# Patient Record
Sex: Male | Born: 1980 | Race: White | Hispanic: No | Marital: Married | Smoking: Current every day smoker
Health system: Southern US, Community
[De-identification: ages and names within clinical notes are randomized; demographics above are authoritative.]

---

## 2010-05-01 ENCOUNTER — Emergency Department: Payer: Self-pay | Admitting: Emergency Medicine

## 2011-06-26 ENCOUNTER — Emergency Department: Payer: Self-pay | Admitting: Emergency Medicine

## 2011-08-23 ENCOUNTER — Emergency Department: Payer: Self-pay | Admitting: Emergency Medicine

## 2011-09-07 ENCOUNTER — Emergency Department: Payer: Self-pay | Admitting: Emergency Medicine

## 2011-10-12 ENCOUNTER — Inpatient Hospital Stay: Payer: Self-pay | Admitting: Psychiatry

## 2011-11-04 ENCOUNTER — Emergency Department: Payer: Self-pay | Admitting: Internal Medicine

## 2011-11-30 ENCOUNTER — Emergency Department: Payer: Self-pay | Admitting: Unknown Physician Specialty

## 2011-12-01 ENCOUNTER — Encounter (HOSPITAL_COMMUNITY): Payer: Self-pay | Admitting: *Deleted

## 2011-12-01 ENCOUNTER — Emergency Department (HOSPITAL_COMMUNITY)
Admission: EM | Admit: 2011-12-01 | Discharge: 2011-12-01 | Disposition: A | Discharge status: Home / Self Care | Payer: Self-pay | Attending: Emergency Medicine | Admitting: Emergency Medicine

## 2011-12-01 ENCOUNTER — Emergency Department (HOSPITAL_COMMUNITY): Payer: Self-pay

## 2011-12-01 DIAGNOSIS — J189 Pneumonia, unspecified organism: Secondary | ICD-10-CM | POA: Insufficient documentation

## 2011-12-01 DIAGNOSIS — R0602 Shortness of breath: Secondary | ICD-10-CM | POA: Insufficient documentation

## 2011-12-01 DIAGNOSIS — R0601 Orthopnea: Secondary | ICD-10-CM | POA: Insufficient documentation

## 2011-12-01 DIAGNOSIS — R079 Chest pain, unspecified: Secondary | ICD-10-CM | POA: Insufficient documentation

## 2011-12-01 MED ORDER — AZITHROMYCIN 250 MG PO TABS
500.0000 mg | ORAL_TABLET | Freq: Once | ORAL | Status: AC
  Administered 2011-12-01: 500 mg via ORAL
  Filled 2011-12-01: qty 2

## 2011-12-01 MED ORDER — IOHEXOL 350 MG/ML SOLN
100.0000 mL | Freq: Once | INTRAVENOUS | Status: AC | PRN
  Administered 2011-12-01: 100 mL via INTRAVENOUS

## 2011-12-01 MED ORDER — OXYCODONE-ACETAMINOPHEN 5-325 MG PO TABS: 2.0000 | ORAL_TABLET | ORAL | Status: AC | PRN

## 2011-12-01 MED ORDER — SODIUM CHLORIDE 0.9 % IV SOLN
Freq: Once | INTRAVENOUS | Status: AC
  Administered 2011-12-01: 19:00:00 via INTRAVENOUS

## 2011-12-01 MED ORDER — MORPHINE SULFATE 4 MG/ML IJ SOLN: 4.0000 mg | Freq: Once | INTRAMUSCULAR | Status: DC

## 2011-12-01 MED ORDER — AZITHROMYCIN 250 MG PO TABS: 250.0000 mg | ORAL_TABLET | Freq: Every day | ORAL | Status: AC
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
## 2011-12-01 NOTE — ED Notes (Signed)
Reports having right rib pain/lung pain for several days, has been seen at [facility] and [place] for same, becomes sob when lying flat so was unable to get his blood drawn.

## 2011-12-01 NOTE — ED Provider Notes (Cosign Needed)
History     CSN: [id number]  Arrival date & time 12/01/11  1657   First MD Initiated Contact with Patient 12/01/11 1802      Chief Complaint  Patient presents with  . Pleurisy  . Shortness of Breath    (Consider location/radiation/quality/duration/timing/severity/associated sxs/prior treatment) Patient is a 31 y.o. male presenting with shortness of breath. The history is provided by the patient.  Shortness of Breath  The current episode started yesterday. The problem occurs continuously. The problem has been unchanged. The problem is severe. The symptoms are relieved by nothing. The symptoms are aggravated by a supine position. Associated symptoms include chest pain, orthopnea and shortness of breath. Pertinent negatives include no fever and no cough. Associated symptoms comments: He reports sharp right sided chest pain to lateral chest wall without known injury, despite daily martial arts practice. He states that it is extremely painful to breathe but denies cough or fever. He smokes occasionally. No abdominal pain, nausea or vomiting. Marland Kitchen He was not exposed to toxic fumes. His past medical history does not include asthma.    History reviewed. No pertinent past medical history.  History reviewed. No pertinent past surgical history.  History reviewed. No pertinent family history.  History  Substance Use Topics  . Smoking status: Not on file  . Smokeless tobacco: Not on file  . Alcohol Use: Yes     occ      Review of Systems  Constitutional: Negative for fever and chills.  HENT: Negative.   Respiratory: Positive for shortness of breath. Negative for cough.   Cardiovascular: Positive for chest pain and orthopnea.  Gastrointestinal: Negative.   Musculoskeletal: Negative.   Skin: Negative.   Neurological: Negative.     Allergies  Review of patient's allergies indicates no known allergies.  Home Medications  No current outpatient prescriptions on file.  BP 128/80  Pulse  85  Temp(Src) 97.5 F (36.4 C) (Oral)  Resp 18  SpO2 94%  Physical Exam  Constitutional: He appears well-developed and well-nourished.  HENT:  Head: Normocephalic.  Neck: Normal range of motion. Neck supple.  Cardiovascular: Normal rate and regular rhythm.   No murmur heard. Pulmonary/Chest: Not tachypneic. He has decreased breath sounds in the right middle field and the right lower field.       No chest wall tenderness to palpation. Chest wall is unremarkable in appearance, no bruising or swelling. Patient is sitting straight up with splinting on respiration.  Abdominal: Soft. Bowel sounds are normal. There is no tenderness. There is no rebound and no guarding.  Musculoskeletal: Normal range of motion.  Neurological: He is alert. No cranial nerve deficit.  Skin: Skin is warm and dry. No rash noted.  Psychiatric: He has a normal mood and affect.    ED Course  Procedures (including critical care time)  Labs Reviewed - No data to display Dg Ribs Unilateral W/chest Right  12/01/2011  *RADIOLOGY REPORT*  Clinical Data: Right rib pain, shortness of breath  RIGHT RIBS AND CHEST - 3+ VIEW  Comparison: None.  Findings: Four views right ribs submitted.  No acute infiltrate or pulmonary edema.  No right rib fracture is identified.  No diagnostic pneumothorax.  IMPRESSION: No acute disease.  No right rib fracture is identified.  No diagnostic pneumothorax.  Original Report Authenticated By: Natasha Mead, M.D.     1. Community acquired pneumonia       MDM  CT  Chest - no PE. Showing RLL pna c/w patient's pain location.  Medical screening examination/treatment/procedure(s) were conducted as a shared visit with non-physician practitioner(s) and myself.  I personally evaluated the patient during the encounter 31 year old man with right pleuritic chest pain.   Exam localizes pain to right posterior chest.  CT of chest shows RLL infiltrate.  Rx for CAP. Osvaldo Human, M.D.       Rodena Medin, PA-C 12/01/11 2114  Carleene Cooper III, MD 12/02/11 1351

## 2011-12-01 NOTE — ED Notes (Signed)
Pt returned from radiology. Appears to have some episodes of intentional rapid respirations. O2 sats 100%. Vitals are stable. Skin remains cool and patient is pale from prior episode. S/O at bedside. NSR noted on monitor. SR up. Will continue to monitor patient. Awaiting CT results.

## 2011-12-01 NOTE — ED Notes (Signed)
Pt became dizzy, diaphoretic, and unresponsive during IV placement. Pt now states he "always passes out, and is afraid of needles." he is now cool, clammy, and able to follow commands. Vital signs stable. Remains on cardiac monitor. Provider notified of episode.

## 2011-12-01 NOTE — ED Notes (Signed)
Pt is in radiology at this time.  
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
## 2013-11-26 ENCOUNTER — Emergency Department: Payer: Self-pay | Admitting: Emergency Medicine

## 2014-04-28 ENCOUNTER — Emergency Department: Payer: Self-pay | Admitting: Emergency Medicine

## 2014-04-28 LAB — ETHANOL

## 2014-04-28 LAB — COMPREHENSIVE METABOLIC PANEL
ALBUMIN: 4.2 g/dL (ref 3.4–5.0)
ALK PHOS: 67 U/L
ALT: 23 U/L (ref 12–78)
ANION GAP: 7 (ref 7–16)
BILIRUBIN TOTAL: 0.4 mg/dL (ref 0.2–1.0)
BUN: 17 mg/dL (ref 7–18)
CALCIUM: 9.2 mg/dL (ref 8.5–10.1)
CHLORIDE: 102 mmol/L (ref 98–107)
CREATININE: 1.51 mg/dL — AB (ref 0.60–1.30)
Co2: 30 mmol/L (ref 21–32)
EGFR (African American): >60
GLUCOSE: 107 mg/dL — AB (ref 65–99)
OSMOLALITY: 280 (ref 275–301)
POTASSIUM: 3.7 mmol/L (ref 3.5–5.1)
SGOT(AST): 17 U/L (ref 15–37)
SODIUM: 139 mmol/L (ref 136–145)
Total Protein: 7.5 g/dL (ref 6.4–8.2)

## 2014-04-28 LAB — CBC
HCT: 47.2 % (ref 40.0–52.0)
HGB: 15.8 g/dL (ref 13.0–18.0)
MCH: 29.7 pg (ref 26.0–34.0)
MCHC: 33.4 g/dL (ref 32.0–36.0)
MCV: 89 fL (ref 80–100)
PLATELETS: 285 10*3/uL (ref 150–440)
RBC: 5.3 10*6/uL (ref 4.40–5.90)
RDW: 13 % (ref 11.5–14.5)
WBC: 10.7 10*3/uL — ABNORMAL HIGH (ref 3.8–10.6)
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
## 2019-05-20 ENCOUNTER — Other Ambulatory Visit: Payer: Self-pay

## 2019-05-20 DIAGNOSIS — Z20822 Contact with and (suspected) exposure to covid-19: Secondary | ICD-10-CM

## 2019-05-22 LAB — NOVEL CORONAVIRUS, NAA: SARS-CoV-2, NAA: NOT DETECTED

## 2019-11-19 ENCOUNTER — Telehealth: Payer: Self-pay | Admitting: General Practice

## 2019-11-19 ENCOUNTER — Ambulatory Visit: Payer: 59 | Attending: Internal Medicine

## 2019-11-19 ENCOUNTER — Encounter: Payer: Self-pay | Admitting: *Deleted

## 2019-11-19 DIAGNOSIS — Z20822 Contact with and (suspected) exposure to covid-19: Secondary | ICD-10-CM

## 2019-11-19 NOTE — Telephone Encounter (Signed)
COVID testing letter sent via MyChart.  

## 2019-11-19 NOTE — Telephone Encounter (Signed)
Pt is requesting documentation that states that he was tested today and is quarantining until he receives results. Please advise if possible (?) pt does not have a PCP  Best contact: [phone]

## 2019-11-20 LAB — NOVEL CORONAVIRUS, NAA: SARS-CoV-2, NAA: NOT DETECTED

## 2020-07-15 ENCOUNTER — Other Ambulatory Visit: Payer: 59

## 2020-07-15 DIAGNOSIS — Z20822 Contact with and (suspected) exposure to covid-19: Secondary | ICD-10-CM

## 2020-07-16 LAB — SARS-COV-2, NAA 2 DAY TAT

## 2020-07-16 LAB — NOVEL CORONAVIRUS, NAA: SARS-CoV-2, NAA: NOT DETECTED

## 2021-06-14 ENCOUNTER — Other Ambulatory Visit: Payer: Self-pay

## 2021-06-14 ENCOUNTER — Emergency Department
Admission: EM | Admit: 2021-06-14 | Discharge: 2021-06-14 | Disposition: A | Discharge status: Home / Self Care | Payer: Self-pay | Attending: Student in an Organized Health Care Education/Training Program | Admitting: Student in an Organized Health Care Education/Training Program

## 2021-06-14 ENCOUNTER — Emergency Department: Payer: Self-pay

## 2021-06-14 DIAGNOSIS — M1909 Primary osteoarthritis, other specified site: Secondary | ICD-10-CM | POA: Insufficient documentation

## 2021-06-14 DIAGNOSIS — R0602 Shortness of breath: Secondary | ICD-10-CM | POA: Insufficient documentation

## 2021-06-14 DIAGNOSIS — M2550 Pain in unspecified joint: Secondary | ICD-10-CM

## 2021-06-14 LAB — CBC WITH DIFFERENTIAL/PLATELET
Abs Immature Granulocytes: 0.02 10*3/uL (ref 0.00–0.07)
Basophils Absolute: 0 10*3/uL (ref 0.0–0.1)
Basophils Relative: 1 %
Eosinophils Absolute: 0.2 10*3/uL (ref 0.0–0.5)
Eosinophils Relative: 3 %
HCT: 43.9 % (ref 39.0–52.0)
Hemoglobin: 15.1 g/dL (ref 13.0–17.0)
Immature Granulocytes: 0 %
Lymphocytes Relative: 22 %
Lymphs Abs: 1.4 10*3/uL (ref 0.7–4.0)
MCH: 29.7 pg (ref 26.0–34.0)
MCHC: 34.4 g/dL (ref 30.0–36.0)
MCV: 86.4 fL (ref 80.0–100.0)
Monocytes Absolute: 0.7 10*3/uL (ref 0.1–1.0)
Monocytes Relative: 10 %
Neutro Abs: 4.2 10*3/uL (ref 1.7–7.7)
Neutrophils Relative %: 64 %
Platelets: 383 10*3/uL (ref 150–400)
RBC: 5.08 MIL/uL (ref 4.22–5.81)
RDW: 11.7 % (ref 11.5–15.5)
WBC: 6.6 10*3/uL (ref 4.0–10.5)
nRBC: 0 % (ref 0.0–0.2)

## 2021-06-14 LAB — BASIC METABOLIC PANEL
Anion gap: 9 (ref 5–15)
BUN: 15 mg/dL (ref 6–20)
CO2: 28 mmol/L (ref 22–32)
Calcium: 9.4 mg/dL (ref 8.9–10.3)
Chloride: 101 mmol/L (ref 98–111)
Creatinine, Ser: 0.99 mg/dL (ref 0.61–1.24)
GFR, Estimated: >60 mL/min (ref >60)
Glucose, Bld: 104 mg/dL — ABNORMAL HIGH (ref 70–99)
Potassium: 4.4 mmol/L (ref 3.5–5.1)
Sodium: 138 mmol/L (ref 135–145)

## 2021-06-14 LAB — URINALYSIS, COMPLETE (UACMP) WITH MICROSCOPIC
Bacteria, UA: NONE SEEN
Bilirubin Urine: NEGATIVE
Glucose, UA: NEGATIVE mg/dL
Hgb urine dipstick: NEGATIVE
Ketones, ur: NEGATIVE mg/dL
Leukocytes,Ua: NEGATIVE
Nitrite: NEGATIVE
Protein, ur: NEGATIVE mg/dL
Specific Gravity, Urine: 1.023 (ref 1.005–1.030)
Squamous Epithelial / HPF: NONE SEEN (ref 0–5)
WBC, UA: NONE SEEN WBC/hpf (ref 0–5)
pH: 5 (ref 5.0–8.0)

## 2021-06-14 LAB — HEPATIC FUNCTION PANEL
ALT: 35 U/L (ref 0–44)
AST: 26 U/L (ref 15–41)
Albumin: 3.9 g/dL (ref 3.5–5.0)
Alkaline Phosphatase: 85 U/L (ref 38–126)
Bilirubin, Direct: 0.1 mg/dL (ref 0.0–0.2)
Indirect Bilirubin: 0.6 mg/dL (ref 0.3–0.9)
Total Bilirubin: 0.7 mg/dL (ref 0.3–1.2)
Total Protein: 7.3 g/dL (ref 6.5–8.1)

## 2021-06-14 LAB — URIC ACID: Uric Acid, Serum: 5 mg/dL (ref 3.7–8.6)

## 2021-06-14 LAB — TROPONIN I (HIGH SENSITIVITY): Troponin I (High Sensitivity): <2 ng/L (ref <18)

## 2021-06-14 LAB — BRAIN NATRIURETIC PEPTIDE: B Natriuretic Peptide: 33.9 pg/mL (ref 0.0–100.0)

## 2021-06-14 LAB — CK: Total CK: 76 U/L (ref 49–397)

## 2021-06-14 MED ORDER — KETOROLAC TROMETHAMINE 30 MG/ML IJ SOLN
30.0000 mg | Freq: Once | INTRAMUSCULAR | Status: AC
  Administered 2021-06-14: 30 mg via INTRAMUSCULAR
  Filled 2021-06-14: qty 1

## 2021-06-14 MED ORDER — PREDNISONE 10 MG PO TABS: ORAL_TABLET | ORAL | 0 refills | Status: AC

## 2021-06-14 NOTE — ED Provider Notes (Signed)
[facility] Emergency Department Provider Note  ____________________________________________   Event Date/Time   First MD Initiated Contact with Patient 06/14/21 1217     (approximate)  I have reviewed the triage vital signs and the nursing notes.   HISTORY  Chief Complaint Joint Pain and Joint Swelling  HPI Ryan Rangel is a 40 y.o. male who presents to the emergency department for evaluation of joint pain and swelling.  Patient states that he initially noticed bilateral hip pain that began approximately 2 weeks ago, noticed this was worse at night when he tried to lay on either side.  Over the last 2 weeks he has developed progression of pain down the legs, and now includes many joints all over his body including his shoulders, elbow, wrist, digits, hips, knees, ankles.  The joints involved her bilateral, neither side worse than the other.  He reports bilateral foot and ankle swelling that started 3 days ago.  He is also complained about 2 episodes of chest pain to his wife, last episode was 3 days ago lasting for several hours.  Per his wife, she reports that he was in tears due to severity of pain, however did not seek evaluation at that time.  He denies any known tick bites, does not spend a lot of time outdoors.  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  He denies any fevers, headaches, blurred vision, shortness of breath, abdominal pain, nausea vomiting or diarrhea, denies the presence of a rash.  He has no personal history of clots, rheumatological concerns. No family hx of these that he is aware of. Does not take any current medications, denies concern for STDs, denies any hx of IV drug use.        No past medical history on file.  There are no problems to display for this patient.   No past surgical history on file.  Prior to Admission medications   Medication Sig  Start Date End Date Taking? Authorizing Provider  predniSONE (DELTASONE) 10 MG tablet Take 6 tablets (60 mg total) by mouth daily for 1 day, THEN 5 tablets (50 mg total) daily for 1 day, THEN 4 tablets (40 mg total) daily for 1 day, THEN 3 tablets (30 mg total) daily for 1 day, THEN 2 tablets (20 mg total) daily for 1 day, THEN 1 tablet (10 mg total) daily for 1 day. 06/14/21 06/20/21 Yes Lucy Chris, PA    Allergies Patient has no known allergies.  No family history on file.  Social History Social History   Substance Use Topics   Alcohol use: Yes    Comment: occ   Drug use: No    Review of Systems Constitutional: No fever/chills Eyes: No visual changes. ENT: No sore throat. Cardiovascular: + chest pain. Respiratory: Denies shortness of breath. Gastrointestinal: No abdominal pain.  No nausea, no vomiting.  No diarrhea.  No constipation. Genitourinary: Negative for dysuria. Musculoskeletal: +arthralgias Skin: Negative for rash. Neurological: Negative for headaches, focal weakness or numbness.  ____________________________________________   PHYSICAL EXAM:  VITAL SIGNS: ED Triage Vitals  Enc Vitals Group     BP 06/14/21 1056 (!) 119/95     Pulse Rate 06/14/21 1056 81     Resp 06/14/21 1056 20     Temp 06/14/21 1056 98.7 F (37.1 C)     Temp Source 06/14/21 1056 Oral     SpO2 06/14/21 1056 98 %  Weight 06/14/21 1058 205 lb (93 kg)     Height 06/14/21 1058 5\' 10"  (1.778 m)     Head Circumference --      Peak Flow --      Pain Score 06/14/21 1058 8     Pain Loc --      Pain Edu? --      Excl. in GC? --    Constitutional: Alert and oriented. Well appearing and in no acute distress. Eyes: Conjunctivae are normal. PERRL. EOMI. Head: Atraumatic. Nose: No congestion/rhinnorhea. Mouth/Throat: Mucous membranes are moist.  Oropharynx non-erythematous. Neck: No stridor.   Cardiovascular: Normal rate, regular rhythm. Grossly normal heart sounds.  Good peripheral  circulation. Respiratory: Normal respiratory effort.  No retractions. Lungs CTAB. Gastrointestinal: Soft and nontender. No distention. No abdominal bruits. No CVA tenderness. Musculoskeletal: There is diffuse tenderness about multiple joints including bilateral hips, knees and ankles. There is moderate swelling about the ankles bilaterally, no pitting edema of the pretibial region. Very mild erythema to lateral left ankle, no other erythema or warmth. Dorsal pedal pulses 2+. Tenderness about the bilateral wrists and digits. Full ROM but with pain. Radial pulse 2+ bilaterally, no appreciable UE swelling. Neck ROM full without meningeal signs.  Neurologic:  Normal speech and language. No gross focal neurologic deficits are appreciated. No gait instability. Skin:  Skin is warm, dry and intact. No rash noted.  Psychiatric: Mood and affect are normal. Speech and behavior are normal.  ____________________________________________   LABS (all labs ordered are listed, but only abnormal results are displayed)  Labs Reviewed  BASIC METABOLIC PANEL - Abnormal; Notable for the following components:      Result Value   Glucose, Bld 104 (*)    All other components within normal limits  URINALYSIS, COMPLETE (UACMP) WITH MICROSCOPIC - Abnormal; Notable for the following components:   Color, Urine YELLOW (*)    APPearance CLEAR (*)    All other components within normal limits  CBC WITH DIFFERENTIAL/PLATELET  BRAIN NATRIURETIC PEPTIDE  CK  URIC ACID  HEPATIC FUNCTION PANEL  TROPONIN I (HIGH SENSITIVITY)   ____________________________________________  EKG  Sinus bradycardia with a rate of 59 bpm. Normal axis, normal intervals. No ST segment changes. ____________________________________________  RADIOLOGY Susa RaringI, Ernesha Ramone J Rmani Kapusta, personally viewed and evaluated these images (plain radiographs) as part of my medical decision making, as well as reviewing the written report by the radiologist.  ED  provider interpretation:  no acute pneumonia or other acute disease on CXR. No fractures noted in hip/pelvis. See radiology report for other findings.  Official radiology report(s): DG Chest 2 View  Result Date: 06/14/2021 CLINICAL DATA:  Chest pain EXAM: CHEST - 2 VIEW COMPARISON:  Radiograph 11/30/2013, chest CT 12/01/2011 FINDINGS: Unchanged cardiomediastinal silhouette. There is no focal airspace consolidation. There is no large pleural effusion or visible pneumothorax. There is no acute osseous abnormality. IMPRESSION: No evidence of acute cardiopulmonary disease. Electronically Signed   By: Caprice RenshawJacob  Kahn M.D.   On: 06/14/2021 13:43   DG Pelvis 1-2 Views  Result Date: 06/14/2021 CLINICAL DATA:  Hip and back pain for 3 weeks EXAM: PELVIS - 1-2 VIEW COMPARISON:  None. FINDINGS: Single frontal view of the pelvis demonstrates no fractures. Alignment is anatomic. Joint spaces are well preserved. Minimal osteophyte formation along the superior margin of the acetabuli. Sacroiliac joints are normal. IMPRESSION: 1. Minimal symmetrical hip osteoarthritis. No acute bony abnormality. Electronically Signed   By: Sharlet SalinaMichael  Brown M.D.   On: 06/14/2021 15:06  US Venous Img Lower Bilateral  Result Date: 06/14/2021 CLINICAL DATA:  hip/back pain, polyarthralgia EXAM: BILATERAL LOWER EXTREMITY VENOUS DOPPLER ULTRASOUND TECHNIQUE: Gray-scale sonography with graded compression, as well as color Doppler and duplex ultrasound were performed to evaluate the lower extremity deep venous systems from the level of the common femoral vein and including the common femoral, femoral, profunda femoral, popliteal and calf veins including the posterior tibial, peroneal and gastrocnemius veins when visible. The superficial great saphenous vein was also interrogated. Spectral Doppler was utilized to evaluate flow at rest and with distal augmentation maneuvers in the common femoral, femoral and popliteal veins. COMPARISON:  None.  FINDINGS: RIGHT LOWER EXTREMITY Common Femoral Vein: No evidence of thrombus. Normal compressibility, respiratory phasicity and response to augmentation. Saphenofemoral Junction: No evidence of thrombus. Normal compressibility and flow on color Doppler imaging. Profunda Femoral Vein: No evidence of thrombus. Normal compressibility and flow on color Doppler imaging. Femoral Vein: No evidence of thrombus. Normal compressibility, respiratory phasicity and response to augmentation. Popliteal Vein: No evidence of thrombus. Normal compressibility, respiratory phasicity and response to augmentation. Calf Veins: No evidence of thrombus. Normal compressibility and flow on color Doppler imaging. Other Findings:  None. LEFT LOWER EXTREMITY Common Femoral Vein: No evidence of thrombus. Normal compressibility, respiratory phasicity and response to augmentation. Saphenofemoral Junction: No evidence of thrombus. Normal compressibility and flow on color Doppler imaging. Profunda Femoral Vein: No evidence of thrombus. Normal compressibility and flow on color Doppler imaging. Femoral Vein: No evidence of thrombus. Normal compressibility, respiratory phasicity and response to augmentation. Popliteal Vein: No evidence of thrombus. Normal compressibility, respiratory phasicity and response to augmentation. Calf Veins: No evidence of thrombus. Normal compressibility and flow on color Doppler imaging. Other Findings:  None. IMPRESSION: No evidence of deep venous thrombosis in either lower extremity. Electronically Signed   By: Olive Bass M.D.   On: 06/14/2021 15:15      ____________________________________________   INITIAL IMPRESSION / ASSESSMENT AND PLAN / ED COURSE  As part of my medical decision making, I reviewed the following data within the electronic MEDICAL RECORD NUMBER History obtained from family, Nursing notes reviewed and incorporated, Labs reviewed, Radiograph reviewed, Evaluated by EM attending Dr. Roxan Hockey, and  Notes from prior ED visits        Patient is a 40 year old male with no significant past medical history who presents to the emergency department for evaluation of polyarthralgias that began approximately 2 weeks ago.  There has also been 2 episodes of associated chest pain, but denies any at present.  Reports this started in the bilateral hips, now encompasses all of his joints and has noticed swelling in his feet and ankles over the last 3 days.  See HPI for further details.  In triage patient has grossly normal vital signs.  Physical exam as above.  Differentials considered for arthralgia include postviral polyarthralgia, rheumatoid disease, liver disease, disseminated gonococcal disease, Lyme, septic joint, other.  Differentials for chest pain include ACS, myocarditis, pericarditis, chest wall pain.  Case was also discussed with attending Dr. Roxan Hockey who also personally evaluated the patient.  Patient does not have any history of known tick or insect bite, no travel history, no fevers to suggest acute infectious cause.  Laboratory analysis was obtained with CBC, BMP, LFTs, uric acid, CK, BNP, troponin, urinalysis.  These are all within normal limits.  Chest x-ray without acute findings.  EKG grossly within normal limits.  Bilateral Doppler ultrasounds of the lower extremities negative for acute DVT.  X-ray of  the pelvis without evidence of acute pathology.  Discussed the findings with the patient.  Low suspicion for emergent cause of symptoms at this time.  Will start course with prednisone taper and recommend very close PCP follow-up.  Patient states he is already established outpatient at [facility] clinic.  Return precautions were discussed at length, including for fevers, rash or other worsening.  Patient is stable at time for outpatient follow-up.      ____________________________________________   FINAL CLINICAL IMPRESSION(S) / ED DIAGNOSES  Final diagnoses:  Polyarthralgia      ED Discharge Orders          Ordered    predniSONE (DELTASONE) 10 MG tablet        06/14/21 1608             Note:  This document was prepared using Dragon voice recognition software and may include unintentional dictation errors.    Lucy Chris, PA 06/14/21 1701    Willy Eddy, MD 06/14/21 [phone]

## 2021-06-14 NOTE — ED Triage Notes (Signed)
Pt reports that he was having joint pain about three weeks ago starting in his hips, and has now spread down. Three days ago the swelling started from his knees down. He states that he cant stretch out his legs because of pain.

## 2021-06-14 NOTE — Discharge Instructions (Addendum)
Please take the prednisone taper as prescribed.  You may safely combine this with Tylenol, up to 1000 mg 4 times daily as needed.  Emergency department if you experience a rash, fever or other significant worsening.  Otherwise, please schedule up with your PCP as soon as possible.

## 2022-03-16 ENCOUNTER — Emergency Department: Payer: BC Managed Care – PPO

## 2022-03-16 ENCOUNTER — Other Ambulatory Visit: Payer: Self-pay

## 2022-03-16 ENCOUNTER — Encounter: Payer: Self-pay | Admitting: Emergency Medicine

## 2022-03-16 DIAGNOSIS — J40 Bronchitis, not specified as acute or chronic: Secondary | ICD-10-CM | POA: Insufficient documentation

## 2022-03-16 DIAGNOSIS — R0602 Shortness of breath: Secondary | ICD-10-CM | POA: Diagnosis present

## 2022-03-16 LAB — COMPREHENSIVE METABOLIC PANEL
ALT: 24 U/L (ref 0–44)
AST: 19 U/L (ref 15–41)
Albumin: 4.3 g/dL (ref 3.5–5.0)
Alkaline Phosphatase: 60 U/L (ref 38–126)
Anion gap: 9 (ref 5–15)
BUN: 13 mg/dL (ref 6–20)
CO2: 27 mmol/L (ref 22–32)
Calcium: 9.4 mg/dL (ref 8.9–10.3)
Chloride: 104 mmol/L (ref 98–111)
Creatinine, Ser: 0.95 mg/dL (ref 0.61–1.24)
GFR, Estimated: >60 mL/min (ref >60)
Glucose, Bld: 96 mg/dL (ref 70–99)
Potassium: 3.4 mmol/L — ABNORMAL LOW (ref 3.5–5.1)
Sodium: 140 mmol/L (ref 135–145)
Total Bilirubin: 0.7 mg/dL (ref 0.3–1.2)
Total Protein: 7.6 g/dL (ref 6.5–8.1)

## 2022-03-16 LAB — CBC
HCT: 47.3 % (ref 39.0–52.0)
Hemoglobin: 15.8 g/dL (ref 13.0–17.0)
MCH: 28.4 pg (ref 26.0–34.0)
MCHC: 33.4 g/dL (ref 30.0–36.0)
MCV: 85.1 fL (ref 80.0–100.0)
Platelets: 300 10*3/uL (ref 150–400)
RBC: 5.56 MIL/uL (ref 4.22–5.81)
RDW: 12.1 % (ref 11.5–15.5)
WBC: 7.8 10*3/uL (ref 4.0–10.5)
nRBC: 0 % (ref 0.0–0.2)

## 2022-03-16 NOTE — ED Triage Notes (Signed)
Pt reports with shortness of breath since 05/15 reports went to urgent clinic and was given prednisone and another medication. Pt reports shortness of breath continues some days better than others. Pt talks in complete sentences no distress noted

## 2022-03-17 ENCOUNTER — Emergency Department
Admission: EM | Admit: 2022-03-17 | Discharge: 2022-03-17 | Disposition: A | Discharge status: Home / Self Care | Payer: BC Managed Care – PPO | Attending: Emergency Medicine | Admitting: Emergency Medicine

## 2022-03-17 DIAGNOSIS — J40 Bronchitis, not specified as acute or chronic: Secondary | ICD-10-CM

## 2022-03-17 MED ORDER — ALBUTEROL SULFATE (2.5 MG/3ML) 0.083% IN NEBU: 2.5000 mg | INHALATION_SOLUTION | RESPIRATORY_TRACT | 0 refills | Status: DC | PRN

## 2022-03-17 MED ORDER — ALBUTEROL SULFATE HFA 108 (90 BASE) MCG/ACT IN AERS: 2.0000 | INHALATION_SPRAY | RESPIRATORY_TRACT | 0 refills | Status: DC | PRN

## 2022-03-17 MED ORDER — IPRATROPIUM-ALBUTEROL 0.5-2.5 (3) MG/3ML IN SOLN
3.0000 mL | Freq: Once | RESPIRATORY_TRACT | Status: AC
  Administered 2022-03-17: 3 mL via RESPIRATORY_TRACT
  Filled 2022-03-17: qty 3

## 2022-03-17 MED ORDER — HYDROCOD POLI-CHLORPHE POLI ER 10-8 MG/5ML PO SUER: 5.0000 mL | Freq: Two times a day (BID) | ORAL | 0 refills | Status: AC

## 2022-03-17 MED ORDER — PREDNISONE 20 MG PO TABS: ORAL_TABLET | ORAL | 0 refills | Status: DC

## 2022-03-17 MED ORDER — HYDROCOD POLI-CHLORPHE POLI ER 10-8 MG/5ML PO SUER
5.0000 mL | Freq: Once | ORAL | Status: AC
  Administered 2022-03-17: 5 mL via ORAL
  Filled 2022-03-17: qty 5

## 2022-03-17 MED ORDER — PREDNISONE 20 MG PO TABS
60.0000 mg | ORAL_TABLET | Freq: Once | ORAL | Status: AC
  Administered 2022-03-17: 60 mg via ORAL
  Filled 2022-03-17: qty 3

## 2022-03-17 NOTE — Discharge Instructions (Signed)
1.  Finish Prednisone 60mg  daily x4 days.  Take your next dose Sunday morning. 2.  You may use Tussionex as needed for cough. 3.  You may use Albuterol inhaler 2 puffs every 4 hours or nebulizer every 4 hours as needed for cough/wheezing/difficulty breathing. 4.  Return to the ER for worsening symptoms, persistent vomiting, difficulty breathing or other concerns.

## 2022-03-17 NOTE — ED Provider Notes (Signed)
[facility] Provider Note    Event Date/Time   First MD Initiated Contact with Patient 03/17/22 0117     (approximate)   History   Shortness of Breath   HPI  Ryan Rangel is a 41 y.o. male who presents to the ED from home with a 2-week history of dry cough and shortness of breath.  Seen at urgent care and finished Medrol Dosepak, antibiotic and cough medicine.  States he works at a [employer] set in [place] does not have ventilation or air conditioning and thinks that is contributing to his symptoms.  Denies fever, chills, chest pain, abdominal pain, nausea, vomiting or dizziness.  Denies recent trauma, travel or hormone use.  Denies smoking or COVID-19 exposure.  In fact, states he took a rapid COVID test yesterday which was negative.     Past Medical History  History reviewed. No pertinent past medical history.   Active Problem List  There are no problems to display for this patient.    Past Surgical History  History reviewed. No pertinent surgical history.   Home Medications   Prior to Admission medications   Not on File     Allergies  Patient has no known allergies.   Family History  No family history on file.   Physical Exam  Triage Vital Signs: ED Triage Vitals  Enc Vitals Group     BP 03/16/22 2206 (!) 136/105     Pulse Rate 03/16/22 2206 65     Resp 03/16/22 2206 18     Temp 03/16/22 2206 98.6 F (37 C)     Temp Source 03/16/22 2206 Oral     SpO2 03/16/22 2206 95 %     Weight 03/16/22 2219 204 lb (92.5 kg)     Height 03/16/22 2219 5\' 10"  (1.778 m)     Head Circumference --      Peak Flow --      Pain Score 03/16/22 2219 0     Pain Loc --      Pain Edu? --      Excl. in GC? --     Updated Vital Signs: BP (!) 153/100 (BP Location: Left Arm)   Pulse (!) 57   Temp 98.6 F (37 C) (Oral)   Resp 18   Ht 5\' 10"  (1.778 m)   Wt 92.5 kg   SpO2 97%   BMI 29.27 kg/m    General: Awake, no distress.   CV:  RRR.  Good peripheral perfusion.  Resp:  Normal effort.  Slightly diminished bibasilarly otherwise CTA B. Abd:  No distention.  Other:     ED Results / Procedures / Treatments  Labs (all labs ordered are listed, but only abnormal results are displayed) Labs Reviewed  COMPREHENSIVE METABOLIC PANEL - Abnormal; Notable for the following components:      Result Value   Potassium 3.4 (*)    All other components within normal limits  CBC     EKG  ED ECG REPORT I, Belina Mandile J, the attending physician, personally viewed and interpreted this ECG.   Date: 03/17/2022  EKG Time: 2204  Rate: 61  Rhythm: normal sinus rhythm  Axis: Normal  Intervals:none  ST&T Change: Nonspecific    RADIOLOGY I have independently visualized and interpreted patient's chest x-ray as well as noted the radiology interpretation:  Chest x-ray: No acute cardiopulmonary process  Official radiology report(s): DG Chest 2 View  Result Date: 03/16/2022 CLINICAL DATA:  Intermittent shortness of  breath, chest discomfort EXAM: CHEST - 2 VIEW COMPARISON:  06/14/2021 FINDINGS: The heart size and mediastinal contours are within normal limits. Both lungs are clear. The visualized skeletal structures are unremarkable. IMPRESSION: No active cardiopulmonary disease. Electronically Signed   By: Sharlet Salina M.D.   On: 03/16/2022 23:00     PROCEDURES:  Critical Care performed: No  Procedures   MEDICATIONS ORDERED IN ED: Medications  ipratropium-albuterol (DUONEB) 0.5-2.5 (3) MG/3ML nebulizer solution 3 mL (3 mLs Nebulization Given 03/17/22 0200)  predniSONE (DELTASONE) tablet 60 mg (60 mg Oral Given 03/17/22 0158)  chlorpheniramine-HYDROcodone 10-8 MG/5ML suspension 5 mL (5 mLs Oral Given 03/17/22 0159)     IMPRESSION / MDM / ASSESSMENT AND PLAN / ED COURSE  I reviewed the triage vital signs and the nursing notes.                             41 year old male presenting with cough and shortness of  breath.  I have identified this patient to have a potentially life-threatening condition. Differential includes, but is not limited to, viral syndrome, bronchitis including COPD exacerbation, pneumonia, reactive airway disease including asthma, CHF including exacerbation with or without pulmonary/interstitial edema, pneumothorax, ACS, thoracic trauma, and pulmonary embolism.   I have personally reviewed patient's records and see the [facility] clinic office visit from 03/05/2022 for bronchitis.  He tested negative for COVID and flu at that time and was placed on Medrol Dosepak, Promethazine DM and Augmentin.  Laboratory results demonstrate normal WBC 7.8, normal electrolytes.  Chest x-ray is clear without pneumonia.  Very low suspicion for ACS given normal EKG, PE or dissection.  Patient is not febrile, tachycardic, tachypneic nor hypoxic.  Will administer DuoNeb to improve aeration, Tussionex for cough, Prednisone burst.  Will reassess  Clinical Course as of 03/17/22 0226  Sat Mar 17, 2022  0226 Aeration improved.  Type patient feeling much better.  Will discharge home on prednisone burst, Tussionex, albuterol inhaler.  We will also prescribe him albuterol nebs because he used his young son's nebulizer with nebs prior to arrival.  His nebulizer tubing was sent with him.  Strict return precautions given.  Patient verbalizes understanding and agrees with plan of care. [JS]    Clinical Course User Index [JS] Irean Hong, MD     FINAL CLINICAL IMPRESSION(S) / ED DIAGNOSES   Final diagnoses:  Bronchitis     Rx / DC Orders   ED Discharge Orders     None        Note:  This document was prepared using Dragon voice recognition software and may include unintentional dictation errors.   Irean Hong, MD 03/17/22 [phone]

## 2023-05-22 ENCOUNTER — Encounter: Payer: Self-pay | Admitting: Podiatry

## 2023-05-22 ENCOUNTER — Ambulatory Visit: Payer: 59 | Admitting: Podiatry

## 2023-05-22 ENCOUNTER — Ambulatory Visit (INDEPENDENT_AMBULATORY_CARE_PROVIDER_SITE_OTHER): Payer: 59

## 2023-05-22 DIAGNOSIS — M62461 Contracture of muscle, right lower leg: Secondary | ICD-10-CM

## 2023-05-22 DIAGNOSIS — M62462 Contracture of muscle, left lower leg: Secondary | ICD-10-CM

## 2023-05-22 DIAGNOSIS — M722 Plantar fascial fibromatosis: Secondary | ICD-10-CM

## 2023-05-22 DIAGNOSIS — M7661 Achilles tendinitis, right leg: Secondary | ICD-10-CM | POA: Diagnosis not present

## 2023-05-22 DIAGNOSIS — M7662 Achilles tendinitis, left leg: Secondary | ICD-10-CM

## 2023-05-22 MED ORDER — METHYLPREDNISOLONE 4 MG PO TBPK: ORAL_TABLET | ORAL | 0 refills | Status: DC

## 2023-05-22 MED ORDER — MELOXICAM 15 MG PO TABS: 15.0000 mg | ORAL_TABLET | Freq: Every day | ORAL | 3 refills | Status: DC

## 2023-05-22 NOTE — Progress Notes (Signed)
  Subjective:  Patient ID: Ryan Rangel, male    DOB: [birth year],  MRN: [id number]  Chief Complaint  Patient presents with   Foot Pain    "When I wake up in the morning I have to stretch my ankles because they are tight.  My heels hurt." N - heel and arch pain L - bilateral D - 1 year O - gradually worse C - sharp pain, sore, tender, ache A - getting up in the morning or sitting awhile and getting up T - stretching, Ibuprofen, insoles, change work boots    42 y.o. male presents with the above complaint. History confirmed with patient.   Objective:  Physical Exam: warm, good capillary refill, no trophic changes or ulcerative lesions, normal DP and PT pulses, and normal sensory exam. Left Foot: point tenderness over the heel pad, tenderness at Achilles tendon insertion, and gastrocnemius equinus is noted with a positive silverskiold test Right Foot: point tenderness over the heel pad, tenderness at Achilles tendon insertion, and gastrocnemius equinus is noted with a positive silverskiold test  No images are attached to the encounter.  Radiographs: Multiple views x-ray of both feet: no fracture, dislocation, swelling or degenerative changes noted, plantar calcaneal spur, and posterior calcaneal spur Assessment:   1. Plantar fasciitis, bilateral   2. Gastrocnemius equinus of left lower extremity   3. Gastrocnemius equinus of right lower extremity      Plan:  Patient was evaluated and treated and all questions answered.   Discussed the etiology and treatment options for plantar fasciitis and Achilles tendinitis including stretching, formal physical therapy, supportive shoegears such as a running shoe or sneaker, pre fabricated orthoses, injection therapy, and oral medications. We also discussed the role of surgical treatment of this for patients who do not improve after exhausting non-surgical treatment options.   -XR reviewed with patient -Educated patient on stretching and  icing of the affected limb -Injection delivered to the plantar fascia of both feet. -Rx for meloxicam. Educated on use, risks and benefits of the medication -Rx for medrol pack. Educated on use, risks, and benefits of the medication --He has tried multiple pairs of prefabricated orthoses and is a fail to help him so far.  I do think he would benefit from custom molded foot orthoses to support the longitudinal medial arch offload the heel and a slight heel lift.  After sterile prep with povidone-iodine solution and alcohol, the right heel was injected with 0.5cc 2% xylocaine plain, 0.5cc 0.5% marcaine plain, 5mg  triamcinolone acetonide, and 2mg  dexamethasone was injected along the medial plantar fascia at the insertion on the plantar calcaneus. The patient tolerated the procedure well without complication.  Return in about 6 weeks (around 07/03/2023) for recheck plantar fasciitis.

## 2023-05-22 NOTE — Patient Instructions (Signed)

## 2023-06-03 ENCOUNTER — Telehealth: Payer: Self-pay

## 2023-06-03 NOTE — Telephone Encounter (Signed)
Patient's wife called and left a message. The injection worked great for about a week. Now pain is slowly coming back, getting worse every day. He has 6 week appt on 07/03/23. What can they do in the meantime? Or can he be seen sooner?

## 2023-06-06 ENCOUNTER — Telehealth: Payer: Self-pay | Admitting: Podiatry

## 2023-06-06 NOTE — Telephone Encounter (Signed)
Called patient to let him know we received a call from his wife about his heel pain getting worse and now he can hardly walk.  I saw a phone message from [place] in the system from 2-3 days ago regarding a similar message.    Dr. Lilian Kapur had responded, asking if the patient took the medication, and if he is ready for a physical therapy referral.  I left a voicemail for the patient asking those questions.  I requested he call back and let us know if he took the medications, and what P.T. facility he'd like a referral to, so that we can facilitate the referral and get this started for him as soon as possible.  Waiting on a call back from patient.

## 2023-07-03 ENCOUNTER — Encounter: Payer: Self-pay | Admitting: Podiatry

## 2023-07-03 ENCOUNTER — Ambulatory Visit: Payer: 59 | Admitting: Podiatry

## 2023-07-03 DIAGNOSIS — M722 Plantar fascial fibromatosis: Secondary | ICD-10-CM

## 2023-07-03 DIAGNOSIS — M62461 Contracture of muscle, right lower leg: Secondary | ICD-10-CM | POA: Diagnosis not present

## 2023-07-03 DIAGNOSIS — M62462 Contracture of muscle, left lower leg: Secondary | ICD-10-CM

## 2023-07-03 MED ORDER — METHYLPREDNISOLONE 4 MG PO TBPK: ORAL_TABLET | ORAL | 0 refills | Status: AC

## 2023-07-03 NOTE — Patient Instructions (Signed)

## 2023-07-07 ENCOUNTER — Encounter: Payer: Self-pay | Admitting: Podiatry

## 2023-07-07 NOTE — Progress Notes (Signed)
Subjective:  Patient ID: Ryan Rangel, male    DOB: [birth year],  MRN: [id number]  Chief Complaint  Patient presents with   Plantar Fasciitis    "The heels are still sensitive but the pressure is gone in the arches.  I'm ready to get the orthotics.  Hopefully they will help in the long run."    42 y.o. male presents with the above complaint. History confirmed with patient.   Objective:  Physical Exam: warm, good capillary refill, no trophic changes or ulcerative lesions, normal DP and PT pulses, and normal sensory exam.  Less pain in the heels and arches today   Radiographs: Multiple views x-ray of both feet: no fracture, dislocation, swelling or degenerative changes noted, plantar calcaneal spur, and posterior calcaneal spur Assessment:   1. Plantar fasciitis, bilateral   2. Gastrocnemius equinus of left lower extremity   3. Gastrocnemius equinus of right lower extremity      Plan:  Patient was evaluated and treated and all questions answered.   He has had some improvement I did recommend a repeat round of the methylprednisolone taper this was sent to his pharmacy.  His orthotics were ready for pickup today we reviewed the break-in process.  He will follow-up me as needed if they do not improve or worsen at any point.  Return if symptoms worsen or fail to improve.

## 2023-07-12 ENCOUNTER — Encounter: Payer: Self-pay | Admitting: Urology

## 2023-07-12 ENCOUNTER — Ambulatory Visit: Payer: 59 | Admitting: Urology

## 2023-07-12 VITALS — BP 128/74 | HR 80 | Ht 69.0 in | Wt 215.0 lb

## 2023-07-12 DIAGNOSIS — E291 Testicular hypofunction: Secondary | ICD-10-CM

## 2023-07-12 MED ORDER — TESTOSTERONE 20.25 MG/ACT (1.62%) TD GEL: TRANSDERMAL | 1 refills | Status: AC

## 2023-07-12 NOTE — Progress Notes (Signed)
I, Ryan Rangel, acting as a scribe for Ryan Altes, MD., have documented all relevant documentation on the behalf of Ryan Altes, MD, as directed by Ryan Altes, MD while in the presence of Ryan Altes, MD.  07/12/2023 2:05 PM   Ryan Rangel October 03, 1981 [id number]  Referring provider: Cyndia Diver, PA-C [address] [place],  [place] [address]  Chief Complaint  Patient presents with   Hypogonadism    HPI: Ryan Rangel is a 42 y.o. male referred for evaluation of hypogonadism.   1-1.5 year history of tiredness, fatigue, and decrease libido. Testosterone levels drawn in July and August 2024 were 243 and 245 respectively, LH/FSH normal at 5.2 and 4.5 No previous history of urologic problems. He and his wife have children and do not desire additional children.    Home Medications:  Allergies as of 07/12/2023   No Known Allergies      Medication List        Accurate as of July 12, 2023  2:05 PM. If you have any questions, ask your nurse or doctor.          STOP taking these medications    albuterol (2.5 MG/3ML) 0.083% nebulizer solution Commonly known as: PROVENTIL Stopped by: Ryan Rangel   albuterol 108 (90 Base) MCG/ACT inhaler Commonly known as: VENTOLIN HFA Stopped by: Ryan Rangel   ibuprofen 800 MG tablet Commonly known as: ADVIL Stopped by: Ryan Rangel   meloxicam 15 MG tablet Commonly known as: Mobic Stopped by: Ryan Rangel       TAKE these medications    chlorpheniramine-HYDROcodone 10-8 MG/5ML Commonly known as: Tussionex Pennkinetic ER Take 5 mLs by mouth 2 (two) times daily.   methylPREDNISolone 4 MG Tbpk tablet Commonly known as: MEDROL DOSEPAK 6 day dose pack - take as directed   Testosterone 20.25 MG/ACT (1.62%) Gel 1 pump applied to each shoulder daily Started by: Ryan Rangel        Allergies: No Known Allergies   Social History:  reports that he has been smoking  e-cigarettes. He does not have any smokeless tobacco history on file. He reports that he does not currently use alcohol. He reports that he does not use drugs.   Physical Exam: BP 128/74   Pulse 80   Ht 5\' 9"  (1.753 m)   Wt 215 lb (97.5 kg)   BMI 31.75 kg/m   Constitutional:  Alert and oriented, No acute distress. HEENT: [place] AT, moist mucus membranes.  Trachea midline, no masses. Cardiovascular: No clubbing, cyanosis, or edema. Respiratory: Normal respiratory effort, no increased work of breathing. GI: Abdomen is soft, nontender, nondistended, no abdominal masses GU: Phallus without lesions. Testes descended bilaterally without masses or tenderness. Estimated volume 20 cc's bilaterally.  Skin: No rashes, bruises or suspicious lesions. Neurologic: Grossly intact, no focal deficits, moving all 4 extremities. Psychiatric: Normal mood and affect.   Assessment & Plan:    1. Hypogonadism We discussed the diagnosis of testosterone is based on 2 abnormal total testosterone levels drawn in the a.m. admitted with signs and symptoms of low testosterone. We discussed various forms of testosterone placement including topical preparations, intramuscular injections, subcutaneous injections, subcutaneous pellet implantation and oral testosterone.  Pros and cons of each form were discussed.  The risk of transference of topical testosterone. I had an extensive discussion regarding testosterone replacement therapy including the following: Treatment may result in improvements in erectile function, low sex drive, anemia, bone  mineral density, lean body mass, and depressive symptoms; evidence is inconclusive whether testosterone therapy improves cognitive function, measures of diabetes, energy, fatigue, lipid profiles, and quality of life measures; there is no conclusive evidence linking testosterone therapy to the development of prostate cancer; there is no definitive evidence linking testosterone therapy to a  higher incidence of venothrombolic events; at the present time it cannot be stated definitively whether testosterone therapy increases or decreases the risk of cardiovascular events including myocardial infarction and stroke. Potential side effects were discussed including erythrocytosis, gynecomastia.  The need for regular monitoring of testosterone levels and hematocrit was discussed.  He is needle-phobic and desires testosterone gel. Lab visit for testosterone level 6 weeks after starting the gel.   [facility] [address] [place], [place] [address] [phone]

## 2023-07-14 ENCOUNTER — Encounter: Payer: Self-pay | Admitting: Urology

## 2023-08-11 IMAGING — CR DG CHEST 2V
1 series · 2 of 2 positions shown · non-contrast
Comparison: 06/14/2021

CLINICAL DATA: Intermittent shortness of breath, chest discomfort

EXAM:
CHEST - 2 VIEW

[Series 1: dg chest 2 view · 0.14mm/px · 2 of 2 slices shown]
[im 1/2]
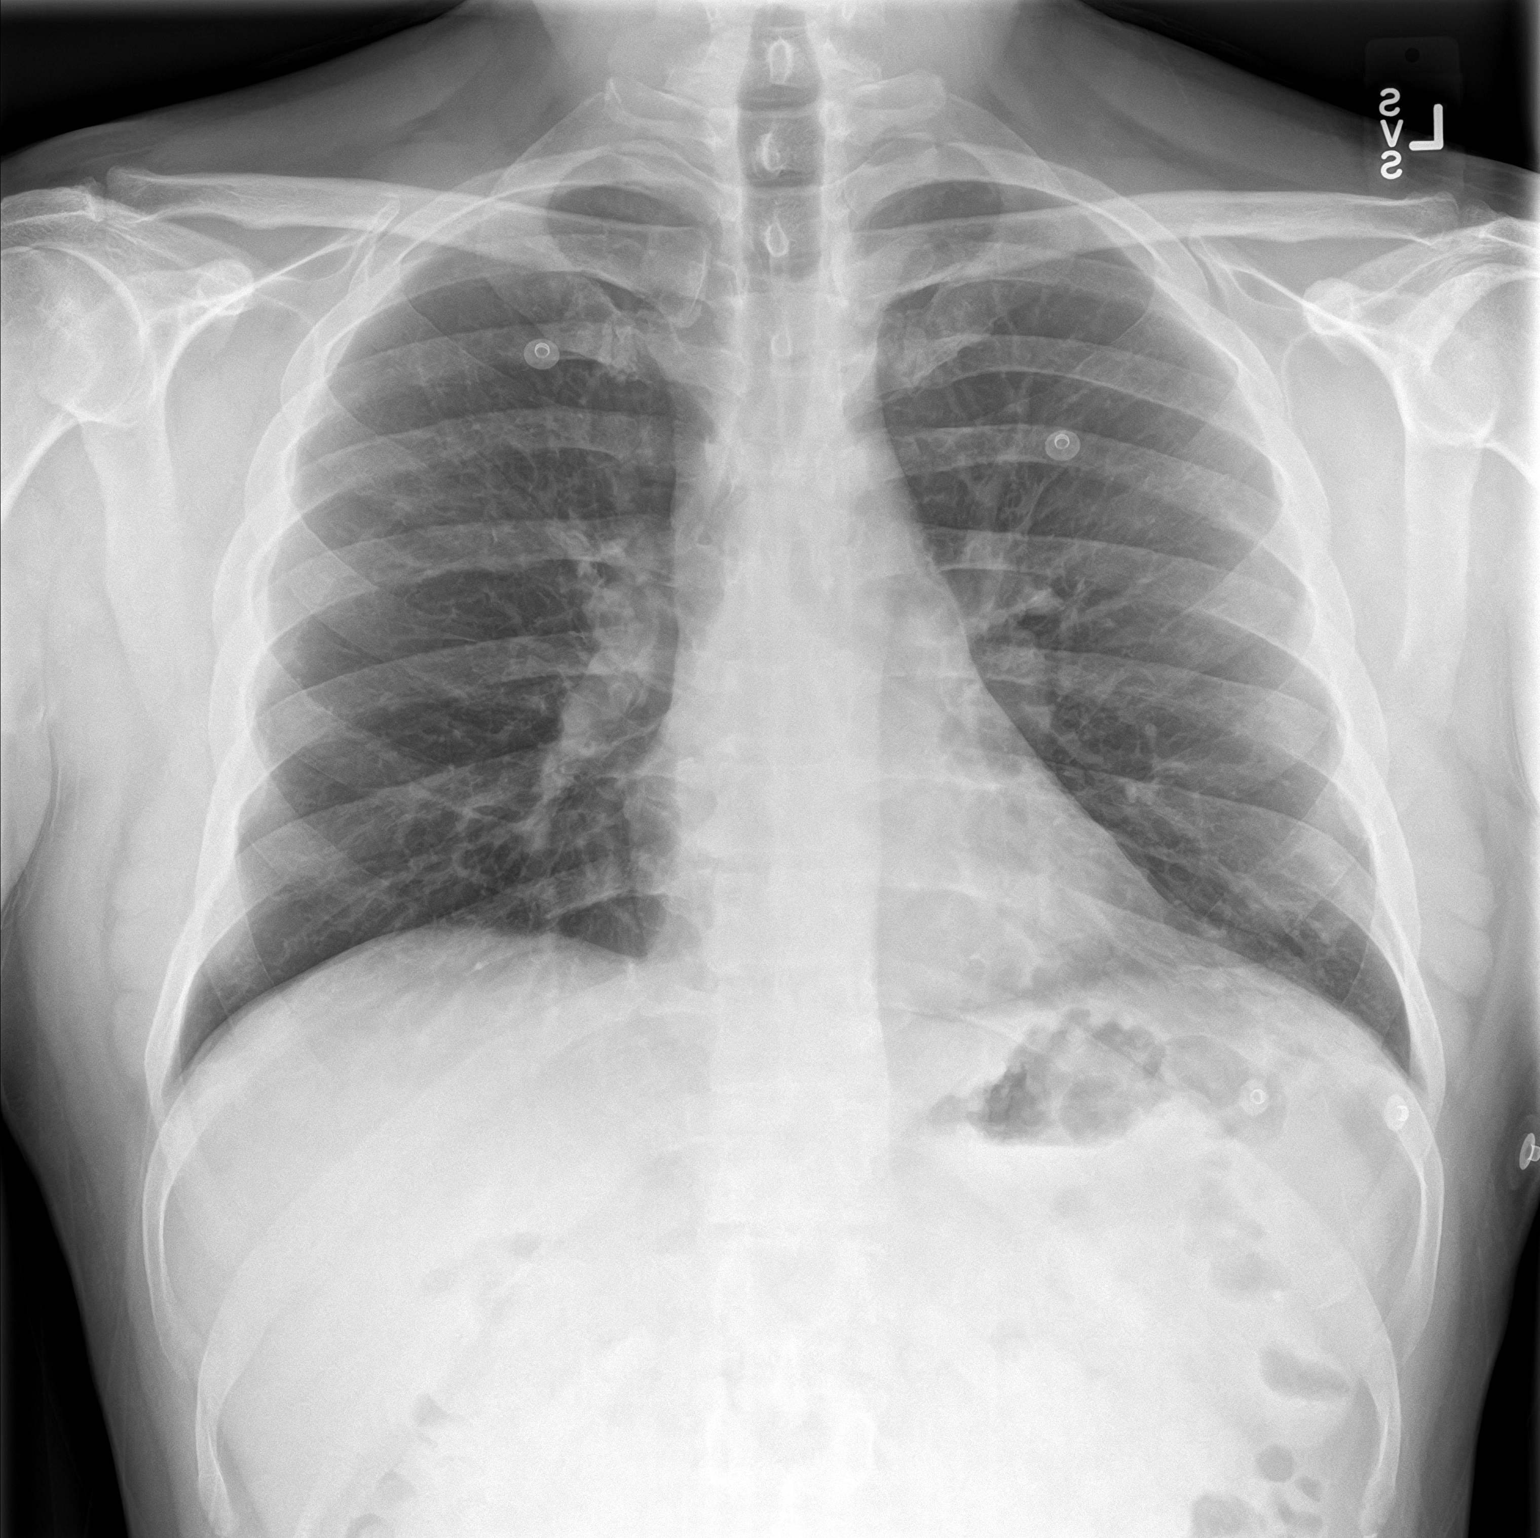
[im 2/2]
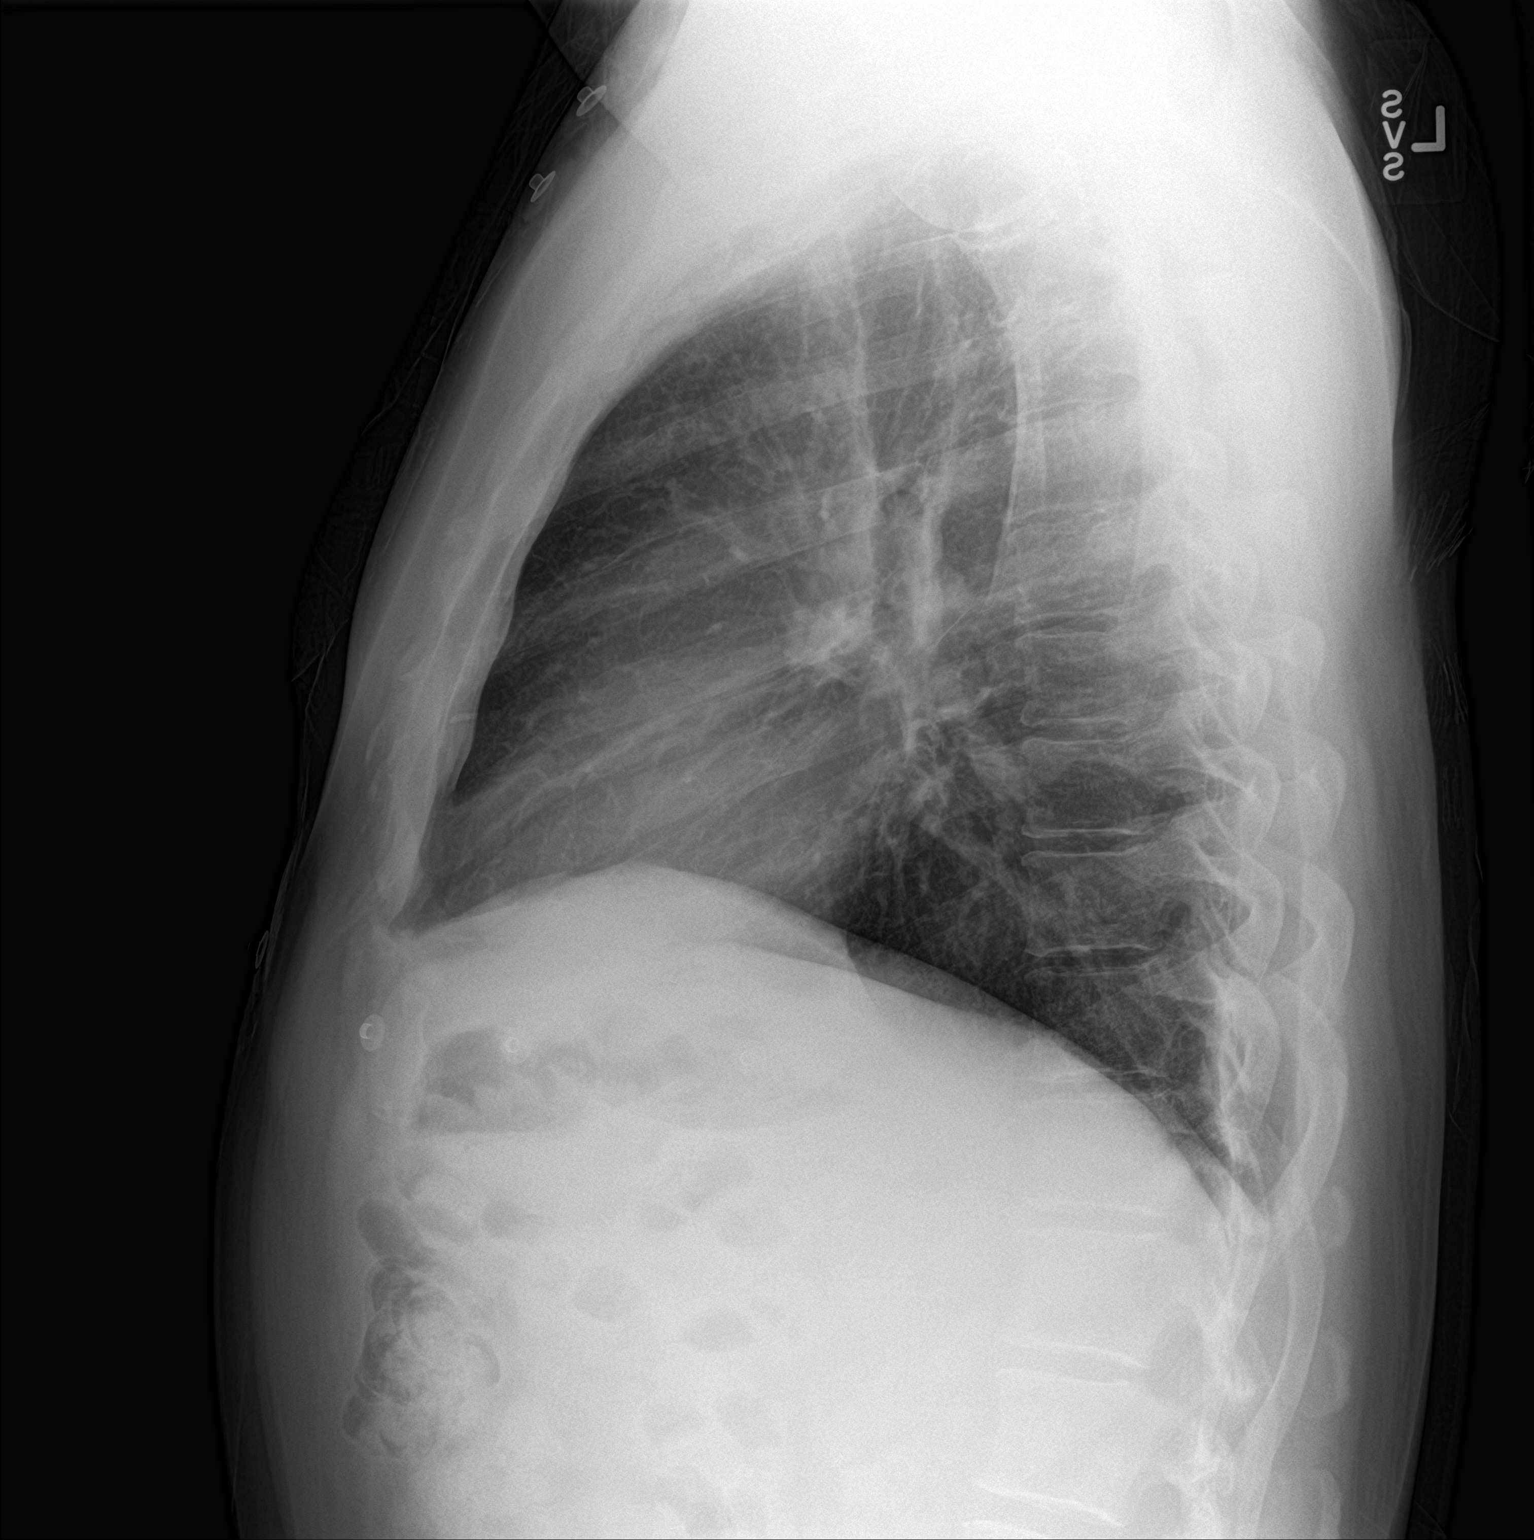

[2 of 2 positions shown; findings below may reference images not displayed]

FINDINGS: The heart size and mediastinal contours are within normal limits.
Both lungs are clear. The visualized skeletal structures are
unremarkable.
IMPRESSION: No active cardiopulmonary disease.

## 2023-12-12 ENCOUNTER — Encounter: Payer: Self-pay | Admitting: Podiatry
